# Patient Record
Sex: Male | Born: 1975 | Hispanic: No | Marital: Married | Smoking: Never smoker
Health system: Southern US, Community
[De-identification: ages and names within clinical notes are randomized; demographics above are authoritative.]

---

## 1998-03-10 ENCOUNTER — Emergency Department (HOSPITAL_COMMUNITY): Admission: EM | Admit: 1998-03-10 | Discharge: 1998-03-10 | Payer: Self-pay | Admitting: Endocrinology

## 1998-03-11 ENCOUNTER — Encounter: Admission: RE | Admit: 1998-03-11 | Discharge: 1998-06-09 | Payer: Self-pay | Admitting: Internal Medicine

## 1999-05-25 ENCOUNTER — Emergency Department (HOSPITAL_COMMUNITY): Admission: EM | Admit: 1999-05-25 | Discharge: 1999-05-25 | Payer: Self-pay | Admitting: Emergency Medicine

## 1999-05-25 ENCOUNTER — Encounter: Payer: Self-pay | Admitting: Emergency Medicine

## 2018-02-11 ENCOUNTER — Emergency Department (HOSPITAL_COMMUNITY): Payer: Self-pay

## 2018-02-11 ENCOUNTER — Emergency Department (HOSPITAL_COMMUNITY)
Admission: EM | Admit: 2018-02-11 | Discharge: 2018-02-11 | Disposition: A | Discharge status: Home / Self Care | Payer: Self-pay | Attending: Emergency Medicine | Admitting: Emergency Medicine

## 2018-02-11 ENCOUNTER — Encounter (HOSPITAL_COMMUNITY): Payer: Self-pay | Admitting: Emergency Medicine

## 2018-02-11 ENCOUNTER — Other Ambulatory Visit: Payer: Self-pay

## 2018-02-11 DIAGNOSIS — W19XXXA Unspecified fall, initial encounter: Secondary | ICD-10-CM

## 2018-02-11 DIAGNOSIS — Y990 Civilian activity done for income or pay: Secondary | ICD-10-CM | POA: Insufficient documentation

## 2018-02-11 DIAGNOSIS — T23002A Burn of unspecified degree of left hand, unspecified site, initial encounter: Secondary | ICD-10-CM

## 2018-02-11 DIAGNOSIS — W010XXA Fall on same level from slipping, tripping and stumbling without subsequent striking against object, initial encounter: Secondary | ICD-10-CM | POA: Insufficient documentation

## 2018-02-11 DIAGNOSIS — Y9389 Activity, other specified: Secondary | ICD-10-CM | POA: Insufficient documentation

## 2018-02-11 DIAGNOSIS — X088XXA Exposure to other specified smoke, fire and flames, initial encounter: Secondary | ICD-10-CM | POA: Insufficient documentation

## 2018-02-11 DIAGNOSIS — Y929 Unspecified place or not applicable: Secondary | ICD-10-CM | POA: Insufficient documentation

## 2018-02-11 DIAGNOSIS — Z23 Encounter for immunization: Secondary | ICD-10-CM | POA: Insufficient documentation

## 2018-02-11 DIAGNOSIS — T310 Burns involving less than 10% of body surface: Secondary | ICD-10-CM | POA: Insufficient documentation

## 2018-02-11 DIAGNOSIS — R112 Nausea with vomiting, unspecified: Secondary | ICD-10-CM

## 2018-02-11 DIAGNOSIS — T23262A Burn of second degree of back of left hand, initial encounter: Secondary | ICD-10-CM | POA: Insufficient documentation

## 2018-02-11 DIAGNOSIS — S0990XA Unspecified injury of head, initial encounter: Secondary | ICD-10-CM

## 2018-02-11 MED ORDER — ONDANSETRON HCL 4 MG PO TABS: 4.0000 mg | ORAL_TABLET | Freq: Three times a day (TID) | ORAL | 0 refills | Status: DC | PRN

## 2018-02-11 MED ORDER — SILVER SULFADIAZINE 1 % EX CREA
TOPICAL_CREAM | Freq: Once | CUTANEOUS | Status: AC
  Administered 2018-02-11: 11:00:00 via TOPICAL
  Filled 2018-02-11: qty 85

## 2018-02-11 MED ORDER — BACITRACIN ZINC 500 UNIT/GM EX OINT: TOPICAL_OINTMENT | Freq: Two times a day (BID) | CUTANEOUS | Status: DC

## 2018-02-11 MED ORDER — SILVER SULFADIAZINE 1 % EX CREA: 1.0000 "application " | TOPICAL_CREAM | Freq: Two times a day (BID) | CUTANEOUS | 0 refills | Status: DC

## 2018-02-11 MED ORDER — TETANUS-DIPHTH-ACELL PERTUSSIS 5-2.5-18.5 LF-MCG/0.5 IM SUSP
0.5000 mL | Freq: Once | INTRAMUSCULAR | Status: AC
  Administered 2018-02-11: 0.5 mL via INTRAMUSCULAR
  Filled 2018-02-11: qty 0.5

## 2018-02-11 NOTE — Discharge Instructions (Addendum)
You were given a prescription for a cream to put on your burn.  Please apply the cream twice daily until the burn has resolved.  Please keep the burn covered, clean and dry while you are at work.  Please monitor for signs of infection.  Please follow-up with your primary care doctor in 1 week for reevaluation.  Please return to the ER if you have any signs of infection such as fevers, pus drainage, warmth, swelling, redness or increased pain to the area. You may take zofran every 8 hours as needed for pain. If your symptoms do not resolve and you have persistent vomiting or abdominal pain you should return to the ER.

## 2018-02-11 NOTE — ED Triage Notes (Signed)
Patient to ED c/o burn to L hand 2 days ago while at work - reports he fell and his hand landed on one of the stove burners. Redness noted to hand, intact blister in between L index finger and middle finger. Saline soaked gauze applied in triage.

## 2018-02-11 NOTE — ED Provider Notes (Signed)
[facility] EMERGENCY DEPARTMENT Provider Note   CSN: [id number] Arrival date & time: 02/11/18  4098     History   Chief Complaint Chief Complaint  Patient presents with  . Hand Burn    HPI Seth Bailey is a 42 y.o. male.  HPI   Patient is a 42 year old male with no significant past history presents emergency department today to be evaluated for burn on his left hand which occurred 2 days ago.  Patient states that he was walking while holding some burners when he was at work, he tripped over a hose and fell onto his left wrist and then hit his head on the ground.  States he hit the back of his head where he did not pass out.  He has had a headache and several episodes of vomiting since this occurred.  Has had some cramping abdominal pain.  No diarrhea, constipation or urinary symptoms.  No fevers. Is complaining of pain to the left hand with the burn is located.  Is also complaining of pain to the left forearm. Denies any other injuries from the fall.  He denies any neck or back pain.  Blood thinners.  States that he has not had a tetanus shot greater than 10 years.  History reviewed. No pertinent past medical history.  There are no active problems to display for this patient.  History reviewed. No pertinent surgical history.    Home Medications    Prior to Admission medications   Medication Sig Start Date End Date Taking? Authorizing Provider  ondansetron (ZOFRAN) 4 MG tablet Take 1 tablet (4 mg total) by mouth every 8 (eight) hours as needed for nausea or vomiting. 02/11/18   Carlas Vandyne S, PA-C  silver sulfADIAZINE (SILVADENE) 1 % cream Apply 1 application topically 2 (two) times daily. Apply twice daily until burn is resolved 02/11/18   Robinn Overholt S, PA-C    Family History No family history on file.  Social History Social History   Tobacco Use  . Smoking status: Never Smoker  . Smokeless tobacco: Never Used  Substance Use Topics    . Alcohol use: Yes    Comment: occasionally  . Drug use: Never     Allergies   Patient has no known allergies.   Review of Systems Review of Systems  Constitutional: Negative for fever.  HENT: Negative for ear pain.   Eyes: Negative for pain and visual disturbance.  Respiratory: Negative for shortness of breath.   Cardiovascular: Negative for chest pain.  Gastrointestinal: Positive for nausea and vomiting. Negative for abdominal pain, blood in stool, constipation and diarrhea.  Genitourinary: Negative for dysuria and hematuria.  Musculoskeletal: Negative for back pain and neck pain.       Left forearm pain, left hand pain  Skin: Positive for wound.  Neurological: Positive for headaches.       Head trauma, no loc  All other systems reviewed and are negative.   Physical Exam Updated Vital Signs BP (!) 159/96 (BP Location: Right Arm)   Pulse 78   Temp 98.8 F (37.1 C) (Oral)   Resp 18   SpO2 93%   Physical Exam  Constitutional: He appears well-developed and well-nourished.  HENT:  Head: Normocephalic and atraumatic.  Mouth/Throat: Oropharynx is clear and moist.  No battle signs, no raccoons eyes, no rhinorrhea. No tenderness to palpation of the skull or face. No deformity or crepitus noted.  Eyes: Pupils are equal, round, and reactive to light. Conjunctivae and  EOM are normal.  Neck: Neck supple.  Cardiovascular: Normal rate, regular rhythm and normal heart sounds.  No murmur heard. Pulmonary/Chest: Effort normal and breath sounds normal. No respiratory distress.  Abdominal: Soft. Bowel sounds are normal. He exhibits no distension. There is no tenderness. There is no guarding.  Musculoskeletal: He exhibits no edema.  No TTP to the cervical, thoracic, or lumbar spine. No pain to the paraspinous muscles. TTP to the distal radius and ulna. No snuff box ttp, distal pulses and sensation intact. stength to BUE intact.   Neurological: He is alert.  Mental Status:  Alert,  thought content appropriate, able to give a coherent history. Speech fluent without evidence of aphasia. Able to follow 2 step commands without difficulty.  Cranial Nerves:  II:  pupils equal, round, reactive to light III,IV, VI: ptosis not present, extra-ocular motions intact bilaterally  V,VII: smile symmetric, facial light touch sensation equal VIII: hearing grossly normal to voice  X: uvula elevates symmetrically  XI: bilateral shoulder shrug symmetric and strong XII: midline tongue extension without fassiculations Motor:  Normal tone. 5/5 strength of BUE and BLE major muscle groups including strong and equal grip strength and dorsiflexion/plantar flexion Sensory: light touch normal in all extremities. CV: 2+ radial and DP/PT pulses  Skin: Skin is warm and dry. Capillary refill takes less than 2 seconds.  1-5cm superficial thickness burn to the lateral portion of the left second phalanx. 1cm superficial thickness burn with blister intact to medial aspect of left index finger. Area is TTP.  Psychiatric: He has a normal mood and affect.  Nursing note and vitals reviewed.   ED Treatments / Results  Labs (all labs ordered are listed, but only abnormal results are displayed) Labs Reviewed - No data to display  EKG None  Radiology Dg Forearm Left  Result Date: 02/11/2018 CLINICAL DATA:  Fall, pain. EXAM: LEFT FOREARM - 2 VIEW COMPARISON:  None. FINDINGS: There is no evidence of fracture or other focal bone lesions. Soft tissues are unremarkable. IMPRESSION: Negative. Electronically Signed   By: Bary RichardStan  Maynard M.D.   On: 02/11/2018 10:32   Dg Wrist Complete Left  Result Date: 02/11/2018 CLINICAL DATA:  Fall, pain. EXAM: LEFT WRIST - COMPLETE 3+ VIEW COMPARISON:  None. FINDINGS: There is no evidence of fracture or dislocation. There is no evidence of arthropathy or other focal bone abnormality. Soft tissues are unremarkable. IMPRESSION: Negative. Electronically Signed   By: Bary RichardStan  Maynard  M.D.   On: 02/11/2018 10:31   Ct Head Wo Contrast  Result Date: 02/11/2018 CLINICAL DATA:  Fall 2 days ago with headaches, initial encounter EXAM: CT HEAD WITHOUT CONTRAST TECHNIQUE: Contiguous axial images were obtained from the base of the skull through the vertex without intravenous contrast. COMPARISON:  None. FINDINGS: Brain: Mild atrophic changes are noted. No findings to suggest acute hemorrhage, acute infarction or space-occupying mass lesion are seen. Vascular: No hyperdense vessel or unexpected calcification. Skull: Normal. Negative for fracture or focal lesion. Sinuses/Orbits: No acute finding. Other: None. IMPRESSION: Mild atrophic changes without acute abnormality. Electronically Signed   By: Alcide CleverMark  Lukens M.D.   On: 02/11/2018 11:00    Procedures Procedures (including critical care time)  Medications Ordered in ED Medications  silver sulfADIAZINE (SILVADENE) 1 % cream ( Topical Given 02/11/18 1123)  Tdap (BOOSTRIX) injection 0.5 mL (0.5 mLs Intramuscular Given 02/11/18 1053)     Initial Impression / Assessment and Plan / ED Course  I have reviewed the triage vital signs and the nursing  notes.  Pertinent labs & imaging results that were available during my care of the patient were reviewed by me and considered in my medical decision making (see chart for details).    Discussed pt presentation and exam findings with Dr. Adriana Simas, who spoke with the patient and agrees with the plan for discharge. Advised to apply silvadene.   Final Clinical Impressions(s) / ED Diagnoses   Final diagnoses:  Burn of left hand, unspecified burn degree, unspecified site of hand, initial encounter  Fall, initial encounter  Injury of head, initial encounter  Non-intractable vomiting with nausea, unspecified vomiting type   Patient presenting after recent fall.  Has burn to the left hand, left forearm pain and head injury.  Episodes of vomiting since fall.  Vital signs stable.  Afebrile.  No midline  tenderness to spine.  No abdominal tenderness.  Neurologically patient is intact.  Burn to the hand is superficial.  Patient has pain to the area.  Does not appear infected.  Silvadene was applied and wound was dressed.  Patient was given information on wound care and was given prescription for Silvadene.  He was given information of follow-up with PCP about resolution of the burn.  His x-ray of his forearm and wrist were negative.  No snuffbox tenderness.  CT of the head was completed and was negative for any cranial abnormality.  Patient's Tdap was updated in the ED.  As above, patient given information for follow-up and instructed on strict return precautions and wound care.  He voiced understanding and agrees with plan.  All questions were answered.  ED Discharge Orders        Ordered    silver sulfADIAZINE (SILVADENE) 1 % cream  2 times daily     02/11/18 1107    ondansetron (ZOFRAN) 4 MG tablet  Every 8 hours PRN     02/11/18 1118       Soua Lenk S, PA-C 02/11/18 1525    Donnetta Hutching, MD 02/14/18 2051

## 2020-01-23 ENCOUNTER — Encounter (HOSPITAL_COMMUNITY): Payer: Self-pay | Admitting: Emergency Medicine

## 2020-01-23 ENCOUNTER — Emergency Department (HOSPITAL_COMMUNITY): Payer: Self-pay

## 2020-01-23 ENCOUNTER — Other Ambulatory Visit: Payer: Self-pay

## 2020-01-23 ENCOUNTER — Emergency Department (HOSPITAL_COMMUNITY)
Admission: EM | Admit: 2020-01-23 | Discharge: 2020-01-24 | Discharge status: Against Medical Advice | Payer: Self-pay | Attending: Emergency Medicine | Admitting: Emergency Medicine

## 2020-01-23 DIAGNOSIS — R110 Nausea: Secondary | ICD-10-CM | POA: Insufficient documentation

## 2020-01-23 DIAGNOSIS — R519 Headache, unspecified: Secondary | ICD-10-CM | POA: Insufficient documentation

## 2020-01-23 DIAGNOSIS — Y939 Activity, unspecified: Secondary | ICD-10-CM | POA: Insufficient documentation

## 2020-01-23 DIAGNOSIS — Y929 Unspecified place or not applicable: Secondary | ICD-10-CM | POA: Insufficient documentation

## 2020-01-23 DIAGNOSIS — Y999 Unspecified external cause status: Secondary | ICD-10-CM | POA: Insufficient documentation

## 2020-01-23 DIAGNOSIS — R0602 Shortness of breath: Secondary | ICD-10-CM | POA: Insufficient documentation

## 2020-01-23 DIAGNOSIS — W01198A Fall on same level from slipping, tripping and stumbling with subsequent striking against other object, initial encounter: Secondary | ICD-10-CM | POA: Insufficient documentation

## 2020-01-23 DIAGNOSIS — S298XXA Other specified injuries of thorax, initial encounter: Secondary | ICD-10-CM | POA: Insufficient documentation

## 2020-01-23 DIAGNOSIS — S161XXA Strain of muscle, fascia and tendon at neck level, initial encounter: Secondary | ICD-10-CM | POA: Insufficient documentation

## 2020-01-23 LAB — BASIC METABOLIC PANEL
Anion gap: 14 (ref 5–15)
BUN: 5 mg/dL — ABNORMAL LOW (ref 6–20)
CO2: 27 mmol/L (ref 22–32)
Calcium: 9.1 mg/dL (ref 8.9–10.3)
Chloride: 98 mmol/L (ref 98–111)
Creatinine, Ser: 0.57 mg/dL — ABNORMAL LOW (ref 0.61–1.24)
GFR calc Af Amer: >60 mL/min (ref >60)
GFR calc non Af Amer: >60 mL/min (ref >60)
Glucose, Bld: 100 mg/dL — ABNORMAL HIGH (ref 70–99)
Potassium: 3.8 mmol/L (ref 3.5–5.1)
Sodium: 139 mmol/L (ref 135–145)

## 2020-01-23 LAB — TROPONIN I (HIGH SENSITIVITY): Troponin I (High Sensitivity): 5 ng/L (ref <18)

## 2020-01-23 LAB — CBC
HCT: 40.4 % (ref 39.0–52.0)
Hemoglobin: 13.7 g/dL (ref 13.0–17.0)
MCH: 29.4 pg (ref 26.0–34.0)
MCHC: 33.9 g/dL (ref 30.0–36.0)
MCV: 86.7 fL (ref 80.0–100.0)
Platelets: 149 10*3/uL — ABNORMAL LOW (ref 150–400)
RBC: 4.66 MIL/uL (ref 4.22–5.81)
RDW: 12.9 % (ref 11.5–15.5)
WBC: 6 10*3/uL (ref 4.0–10.5)
nRBC: 0 % (ref 0.0–0.2)

## 2020-01-23 MED ORDER — SODIUM CHLORIDE 0.9% FLUSH: 3.0000 mL | Freq: Once | INTRAVENOUS | Status: DC

## 2020-01-23 MED ORDER — IBUPROFEN 400 MG PO TABS
400.0000 mg | ORAL_TABLET | Freq: Once | ORAL | Status: AC
Start: 2020-01-24 — End: 2020-01-23
  Administered 2020-01-23: 400 mg via ORAL
  Filled 2020-01-23: qty 1

## 2020-01-23 MED ORDER — HYDROCODONE-ACETAMINOPHEN 5-325 MG PO TABS
1.0000 | ORAL_TABLET | Freq: Once | ORAL | Status: AC
  Administered 2020-01-23: 1 via ORAL
  Filled 2020-01-23: qty 1

## 2020-01-23 NOTE — ED Provider Notes (Signed)
[facility] EMERGENCY DEPARTMENT Provider Note   CSN: [id number] Arrival date & time: 01/23/20  1659     History Chief Complaint  Patient presents with  . Chest Pain    Seth Bailey is a 44 y.o. male.  The history is provided by the patient. A language interpreter was used Darien Ramus - [place] [phone]).  Chest Pain Pain location:  Substernal area Pain radiates to:  Does not radiate Onset quality:  Sudden Duration:  2 days Timing:  Constant Progression:  Worsening Chronicity:  New Context: trauma   Ineffective treatments:  Certain positions (breathing) Associated symptoms: headache, nausea and shortness of breath   Associated symptoms: no fever and no vomiting   Associated symptoms comment:  Denies hemoptysis Risk factors: no smoking   patient was doing cartwheels and fell and something "Cracked" He reports he landed on his head then heard his chest crack No LOC Now reports neck pain       PMH- none Social History   Tobacco Use  . Smoking status: Never Smoker  . Smokeless tobacco: Never Used  Substance Use Topics  . Alcohol use: Yes    Comment: occasionally  . Drug use: Never    Home Medications Prior to Admission medications   Medication Sig Start Date End Date Taking? Authorizing Provider  ondansetron (ZOFRAN) 4 MG tablet Take 1 tablet (4 mg total) by mouth every 8 (eight) hours as needed for nausea or vomiting. 02/11/18   Couture, Cortni S, PA-C  silver sulfADIAZINE (SILVADENE) 1 % cream Apply 1 application topically 2 (two) times daily. Apply twice daily until burn is resolved 02/11/18   Couture, Cortni S, PA-C    Allergies    Patient has no known allergies.  Review of Systems   Review of Systems  Constitutional: Negative for fever.  Respiratory: Positive for shortness of breath.   Cardiovascular: Positive for chest pain.  Gastrointestinal: Positive for nausea. Negative for vomiting.  Musculoskeletal: Positive for neck pain.    Neurological: Positive for headaches. Negative for syncope.       Mild headache  All other systems reviewed and are negative.   Physical Exam Updated Vital Signs BP 138/88 (BP Location: Left Arm)   Pulse 69   Temp 98.1 F (36.7 C) (Oral)   Resp 16   Ht 1.651 m (5\' 5" )   Wt 77.1 kg   SpO2 97%   BMI 28.29 kg/m   Physical Exam CONSTITUTIONAL: Well developed/well nourished HEAD: Normocephalic/atraumatic EYES: EOMI/PERRL ENMT: Mucous membranes moist SPINE/BACK:cervical spine tenderness noted, No bruising/crepitance/stepoffs noted to spine No T/L tenderness CV: S1/S2 noted, no murmurs/rubs/gallops noted LUNGS: Lungs are clear to auscultation bilaterally, no apparent distress Chest - tenderness to sternum.  No crepitus.  Mild bruising.  No flail chest.  No deformities ABDOMEN: soft, nontender, no rebound or guarding, bowel sounds noted throughout abdomen GU:no cva tenderness NEURO: Pt is awake/alert/appropriate, moves all extremitiesx4.  No facial droop. Pt is ambulatory.  No obvious weakness in his upper extremities EXTREMITIES: pulses normal/equal, full ROM.  No deformities SKIN: warm, color normal PSYCH: no abnormalities of mood noted, alert and oriented to situation  ED Results / Procedures / Treatments   Labs (all labs ordered are listed, but only abnormal results are displayed) Labs Reviewed  BASIC METABOLIC PANEL - Abnormal; Notable for the following components:      Result Value   Glucose, Bld 100 (*)    BUN 5 (*)    Creatinine, Ser 0.57 (*)  All other components within normal limits  CBC - Abnormal; Notable for the following components:   Platelets 149 (*)    All other components within normal limits  TROPONIN I (HIGH SENSITIVITY)  TROPONIN I (HIGH SENSITIVITY)    EKG EKG Interpretation  Date/Time:  Tuesday January 23 2020 17:04:41 EDT Ventricular Rate:  75 PR Interval:  152 QRS Duration: 102 QT Interval:  376 QTC Calculation: 419 R Axis:   -10 Text  Interpretation: Normal sinus rhythm Incomplete right bundle branch block Moderate voltage criteria for LVH, may be normal variant ( R in aVL , Cornell product ) Borderline ECG No previous ECGs available Interpretation limited secondary to artifact Confirmed by Ripley Fraise [phone]) on 01/23/2020 11:32:08 PM   Radiology DG Chest 2 View  Result Date: 01/23/2020 CLINICAL DATA:  Chest pain EXAM: CHEST - 2 VIEW COMPARISON:  None. FINDINGS: The heart size and mediastinal contours are within normal limits. Both lungs are clear. The visualized skeletal structures are unremarkable. IMPRESSION: No active cardiopulmonary disease. Electronically Signed   By: Rolm Baptise M.D.   On: 01/23/2020 18:35    Procedures Procedures    Medications Ordered in ED Medications  sodium chloride flush (NS) 0.9 % injection 3 mL (has no administration in time range)  ibuprofen (ADVIL) tablet 400 mg (400 mg Oral Given 01/23/20 2359)  HYDROcodone-acetaminophen (NORCO/VICODIN) 5-325 MG per tablet 1 tablet (1 tablet Oral Given 01/23/20 2359)    ED Course  I have reviewed the triage vital signs and the nursing notes.  Pertinent labs & imaging results that were available during my care of the patient were reviewed by me and considered in my medical decision making (see chart for details).    MDM Rules/Calculators/A&P                      Patient presents after he fell on his head and his neck and chest over 2 days ago while playing with his daughters.  Most of his pain is in his sternum.  No crepitus.  No fractures on x-ray.  I personally reviewed x-rays. He also reported neck pain, and I advised cervical spine x-ray.  He has no focal neuro deficits in his extremities.  He refuses neck xray and requested discharge home.  Advised he can return at anytime.   Final Clinical Impression(s) / ED Diagnoses Final diagnoses:  Blunt trauma to chest, initial encounter  Strain of neck muscle, initial encounter    Rx / DC Orders ED  Discharge Orders    None       Ripley Fraise, MD 01/24/20 [phone]

## 2020-01-23 NOTE — ED Notes (Signed)
ED Provider at bedside. 

## 2020-01-23 NOTE — ED Triage Notes (Signed)
Patient arrives to ED with complaints of centralized chest pain and shortness of breath starting two days ago. Patient states he fell and hit his chest and thinks something popped. Patient states after the fall he threw up blood.

## 2020-01-24 NOTE — ED Notes (Signed)
Pt choosing to leave AMA; MD Wickline at bedside to discuss risks, this RN confirmed risks; pt aware & still to leave AMA. Pt currently calling ride.

## 2020-03-21 ENCOUNTER — Emergency Department (HOSPITAL_COMMUNITY): Payer: Self-pay

## 2020-03-21 ENCOUNTER — Other Ambulatory Visit: Payer: Self-pay

## 2020-03-21 ENCOUNTER — Emergency Department (HOSPITAL_COMMUNITY)
Admission: EM | Admit: 2020-03-21 | Discharge: 2020-03-21 | Disposition: A | Discharge status: Home / Self Care | Payer: Self-pay | Attending: Emergency Medicine | Admitting: Emergency Medicine

## 2020-03-21 ENCOUNTER — Encounter (HOSPITAL_COMMUNITY): Payer: Self-pay

## 2020-03-21 DIAGNOSIS — S0990XA Unspecified injury of head, initial encounter: Secondary | ICD-10-CM

## 2020-03-21 DIAGNOSIS — Y9289 Other specified places as the place of occurrence of the external cause: Secondary | ICD-10-CM | POA: Insufficient documentation

## 2020-03-21 DIAGNOSIS — S0101XA Laceration without foreign body of scalp, initial encounter: Secondary | ICD-10-CM | POA: Insufficient documentation

## 2020-03-21 DIAGNOSIS — W1809XA Striking against other object with subsequent fall, initial encounter: Secondary | ICD-10-CM | POA: Insufficient documentation

## 2020-03-21 DIAGNOSIS — Y999 Unspecified external cause status: Secondary | ICD-10-CM | POA: Insufficient documentation

## 2020-03-21 DIAGNOSIS — Y9389 Activity, other specified: Secondary | ICD-10-CM | POA: Insufficient documentation

## 2020-03-21 NOTE — ED Triage Notes (Signed)
Pt working on tearing a house down and a piece of wood struck pt. Denies LOC. Covered with home cleaner unable to visualize if laceration present.

## 2020-03-21 NOTE — ED Provider Notes (Signed)
[facility]-EMERGENCY DEPT Provider Note   CSN: [id number] Arrival date & time: 03/21/20  1903     History Chief Complaint  Patient presents with  . Head Injury    Seth Bailey is a 44 y.o. male.  Patient is a 44 year old male with no significant past medical history.  He presents for evaluation of a head injury.  Patient was helping his friend renovating a house 4 days ago when a board fell over and struck the top of his head.  This caused a laceration to the frontal region.  Patient applied over-the-counter antiseptic.  He presents today with ongoing headaches and nausea.  He denies any blurry vision.  He denies any numbness or tingling.  The history is provided by the patient.       History reviewed. No pertinent past medical history.  There are no problems to display for this patient.   History reviewed. No pertinent surgical history.     No family history on file.  Social History   Tobacco Use  . Smoking status: Never Smoker  . Smokeless tobacco: Never Used  Substance Use Topics  . Alcohol use: Yes    Comment: occasionally  . Drug use: Never    Home Medications Prior to Admission medications   Not on File    Allergies    Patient has no known allergies.  Review of Systems   Review of Systems  All other systems reviewed and are negative.   Physical Exam Updated Vital Signs BP (!) 141/83 (BP Location: Left Arm)   Pulse 92   Temp 98 F (36.7 C) (Oral)   Resp 17   Ht 5\' 5"  (1.651 m)   Wt 77 kg   SpO2 95%   BMI 28.25 kg/m   Physical Exam Vitals and nursing note reviewed.  Constitutional:      General: He is not in acute distress.    Appearance: Normal appearance. He is not ill-appearing.  HENT:     Head: Normocephalic.     Comments: There is a healing laceration noted to the left frontal scalp.  There is a pink antiseptic solution in place.  This was cleaned with normal saline. Eyes:     Extraocular  Movements: Extraocular movements intact.     Pupils: Pupils are equal, round, and reactive to light.  Pulmonary:     Effort: Pulmonary effort is normal.  Skin:    General: Skin is warm and dry.  Neurological:     General: No focal deficit present.     Mental Status: He is alert and oriented to person, place, and time.     Cranial Nerves: No cranial nerve deficit.     Sensory: No sensory deficit.     Motor: No weakness.     Coordination: Coordination normal.     ED Results / Procedures / Treatments   Labs (all labs ordered are listed, but only abnormal results are displayed) Labs Reviewed - No data to display  EKG None  Radiology No results found.  Procedures Procedures (including critical care time)  Medications Ordered in ED Medications - No data to display  ED Course  I have reviewed the triage vital signs and the nursing notes.  Pertinent labs & imaging results that were available during my care of the patient were reviewed by me and considered in my medical decision making (see chart for details).    MDM Rules/Calculators/A&P  CT negative, patient neurologically intact.  Wound inspected and  cleaned and appears to be healing well.    To be discharged, return prn.  Final Clinical Impression(s) / ED Diagnoses Final diagnoses:  None    Rx / DC Orders ED Discharge Orders    None       Geoffery Lyons, MD 03/21/20 2052

## 2020-03-21 NOTE — Discharge Instructions (Signed)
Take tylenol 1000 mg every six hours as needed for pain.  Local wound care with bacitracin applications twice daily.

## 2020-09-18 ENCOUNTER — Ambulatory Visit (HOSPITAL_COMMUNITY): Payer: Self-pay

## 2020-09-18 ENCOUNTER — Ambulatory Visit (INDEPENDENT_AMBULATORY_CARE_PROVIDER_SITE_OTHER): Payer: Self-pay

## 2020-09-18 ENCOUNTER — Ambulatory Visit (HOSPITAL_COMMUNITY)
Admission: EM | Admit: 2020-09-18 | Discharge: 2020-09-18 | Disposition: A | Discharge status: Home / Self Care | Payer: Self-pay | Attending: Internal Medicine | Admitting: Internal Medicine

## 2020-09-18 ENCOUNTER — Encounter (HOSPITAL_COMMUNITY): Payer: Self-pay | Admitting: Emergency Medicine

## 2020-09-18 ENCOUNTER — Other Ambulatory Visit: Payer: Self-pay

## 2020-09-18 DIAGNOSIS — S93402A Sprain of unspecified ligament of left ankle, initial encounter: Secondary | ICD-10-CM

## 2020-09-18 DIAGNOSIS — S99912A Unspecified injury of left ankle, initial encounter: Secondary | ICD-10-CM

## 2020-09-18 MED ORDER — IBUPROFEN 600 MG PO TABS: 600.0000 mg | ORAL_TABLET | Freq: Four times a day (QID) | ORAL | 0 refills | Status: AC | PRN

## 2020-09-18 NOTE — Discharge Instructions (Signed)
Gentle range of motion exercises Icing of the left ankle Take medications as directed If symptoms worsen please return to urgent care to be reevaluated.

## 2020-09-18 NOTE — ED Triage Notes (Signed)
Pt presents with left foot and ankle pain after board/ wood fell on ankle at job this morning.

## 2020-09-19 NOTE — ED Provider Notes (Signed)
[facility]    CSN: [id number] Arrival date & time: 09/18/20  1424      History   Chief Complaint Chief Complaint  Patient presents with  . Foot Pain  . Ankle Pain    HPI Seth Bailey is a 45 y.o. male comes to the urgent care with complaints of left foot and ankle pain after a piece of plywood fell on his foot.  Patient describes the pain as severe, constant and throbbing.  Aggravated by movement and palpation.  Patient is able to bear weight on the foot.  No numbness or tingling.   He has not tried any over-the-counter medications. HPI  History reviewed. No pertinent past medical history.  There are no problems to display for this patient.   History reviewed. No pertinent surgical history.     Home Medications    Prior to Admission medications   Medication Sig Start Date End Date Taking? Authorizing Provider  ibuprofen (ADVIL) 600 MG tablet Take 1 tablet (600 mg total) by mouth every 6 (six) hours as needed. 09/18/20  Yes Lilyonna Steidle, Britta Mccreedy, MD    Family History History reviewed. No pertinent family history.  Social History Social History   Tobacco Use  . Smoking status: Never Smoker  . Smokeless tobacco: Never Used  Substance Use Topics  . Alcohol use: Yes    Comment: occasionally  . Drug use: Never     Allergies   Patient has no known allergies.   Review of Systems Review of Systems  Constitutional: Negative.   Gastrointestinal: Negative.   Musculoskeletal: Positive for arthralgias. Negative for joint swelling and myalgias.  Neurological: Negative.      Physical Exam Triage Vital Signs ED Triage Vitals  Enc Vitals Group     BP 09/18/20 1535 115/77     Pulse Rate 09/18/20 1535 85     Resp 09/18/20 1535 16     Temp 09/18/20 1535 97.8 F (36.6 C)     Temp Source 09/18/20 1535 Oral     SpO2 09/18/20 1535 98 %     Weight --      Height --      Head Circumference --      Peak Flow --      Pain Score 09/18/20 1532 5      Pain Loc --      Pain Edu? --      Excl. in GC? --    No data found.  Updated Vital Signs BP 115/77 (BP Location: Right Arm)   Pulse 85   Temp 97.8 F (36.6 C) (Oral)   Resp 16   SpO2 98%   Visual Acuity Right Eye Distance:   Left Eye Distance:   Bilateral Distance:    Right Eye Near:   Left Eye Near:    Bilateral Near:     Physical Exam Vitals and nursing note reviewed.  Constitutional:      General: He is in acute distress.     Appearance: He is not ill-appearing.  Musculoskeletal:        General: Tenderness and signs of injury present. No swelling or deformity. Normal range of motion.     Comments: Full range of motion of the left ankle with some pain.  Skin:    Findings: Bruising present.  Neurological:     Mental Status: He is alert.      UC Treatments / Results  Labs (all labs ordered are listed, but only abnormal results are  displayed) Labs Reviewed - No data to display  EKG   Radiology DG Ankle Complete Left  Result Date: 09/18/2020 CLINICAL DATA:  Left ankle trauma EXAM: LEFT ANKLE COMPLETE - 3+ VIEW COMPARISON:  None. FINDINGS: Frontal, oblique, lateral views of the left ankle are obtained. No fracture, subluxation, or dislocation. Mild anterior and lateral soft tissue swelling. Minimal atherosclerosis. IMPRESSION: 1. Anterolateral soft tissue swelling.  No acute fracture. Electronically Signed   By: Sharlet Salina M.D.   On: 09/18/2020 16:38    Procedures Procedures (including critical care time)  Medications Ordered in UC Medications - No data to display  Initial Impression / Assessment and Plan / UC Course  I have reviewed the triage vital signs and the nursing notes.  Pertinent labs & imaging results that were available during my care of the patient were reviewed by me and considered in my medical decision making (see chart for details).     1.  Left ankle sprain: X-ray of the left ankle is negative for acute fracture. Ace wrap of  the left ankle Ibuprofen every 6 hours as needed for pain Gentle range of motion exercises Icing of the left ankle Return precautions given. Final Clinical Impressions(s) / UC Diagnoses   Final diagnoses:  Sprain of left ankle, unspecified ligament, initial encounter     Discharge Instructions     Gentle range of motion exercises Icing of the left ankle Take medications as directed If symptoms worsen please return to urgent care to be reevaluated.   ED Prescriptions    Medication Sig Dispense Auth. Provider   ibuprofen (ADVIL) 600 MG tablet Take 1 tablet (600 mg total) by mouth every 6 (six) hours as needed. 30 tablet Mikaya Bunner, Britta Mccreedy, MD     PDMP not reviewed this encounter.   Merrilee Jansky, MD 09/19/20 1501

## 2021-05-28 IMAGING — CT CT HEAD W/O CM
3 series · 15 of 47 positions shown, 18 images · non-contrast
Comparison: Head CT 02/11/2018

CLINICAL DATA: Struck by piece of [employer] while tearing down house.

Head trauma, minor, normal mental status (Age 19-64y)
EXAM:
CT HEAD WITHOUT CONTRAST
TECHNIQUE: Contiguous axial images were obtained from the base of the skull
through the vertex without intravenous contrast.

[Series 2: head wo · axial · 0.43mm/px · z∈[-99,+36]mm · 9 of 33 slices shown, 12 images]
[im 3/33  brain]
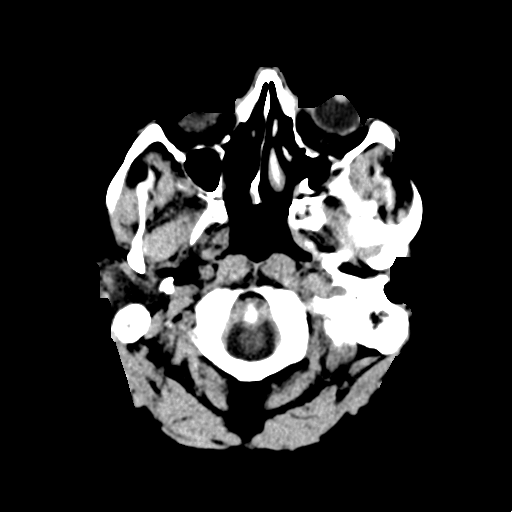
[im 3/33  bone]
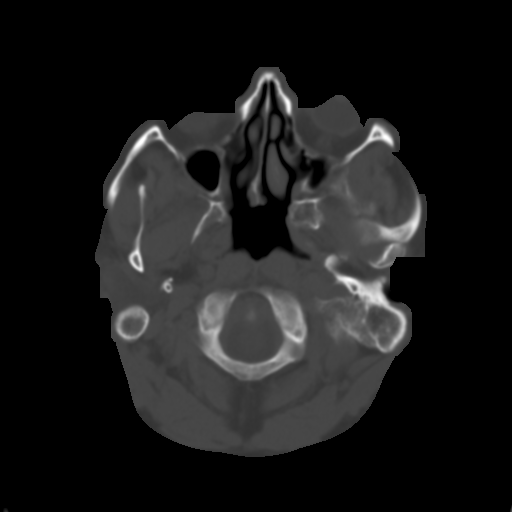
[im 6/33  brain]
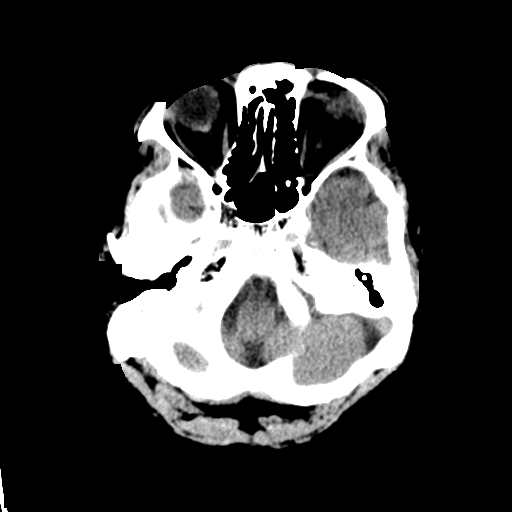
[im 9/33  brain]
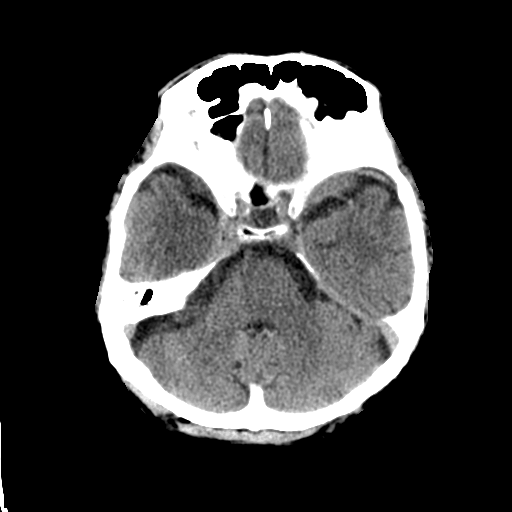
[im 13/33  brain]
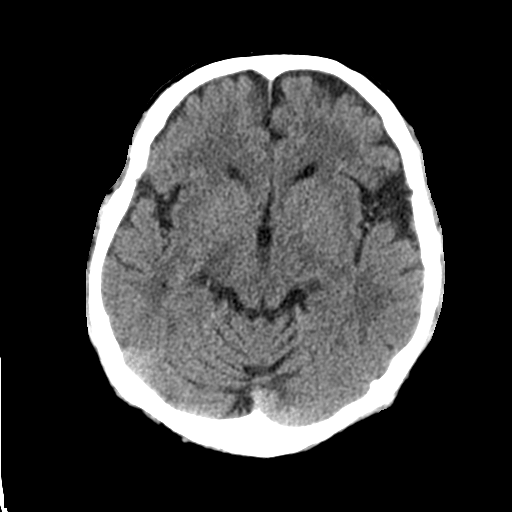
[im 17/33  brain]
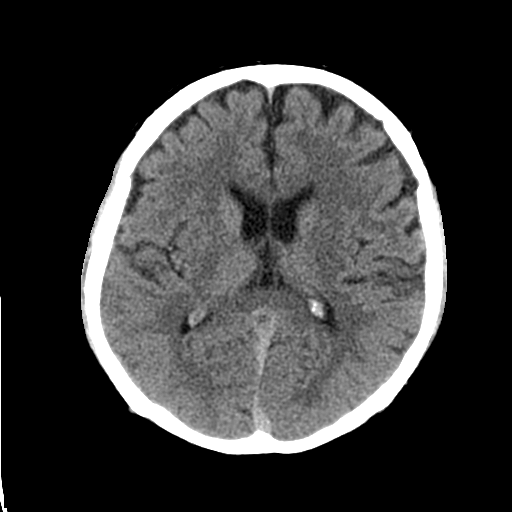
[im 17/33  bone]
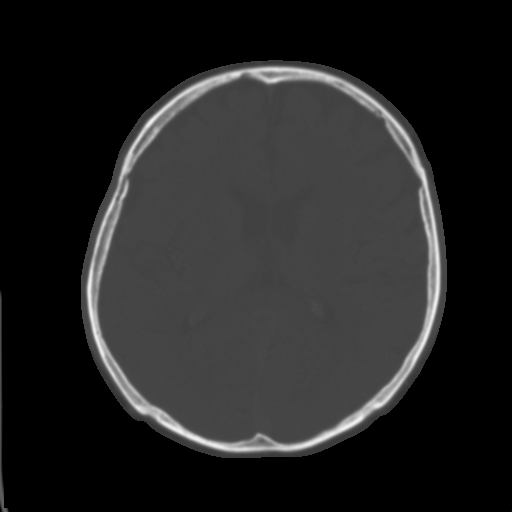
[im 20/33  brain]
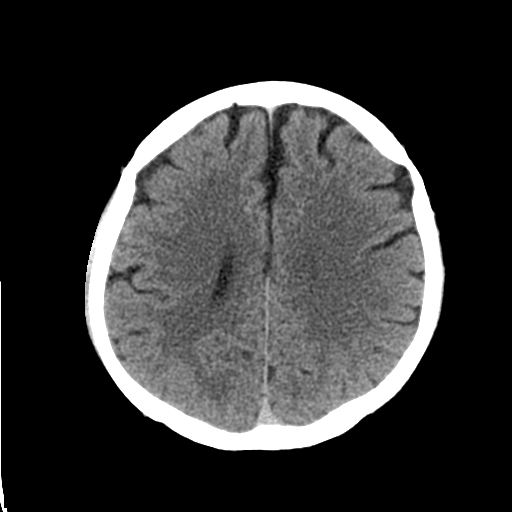
[im 24/33  brain]
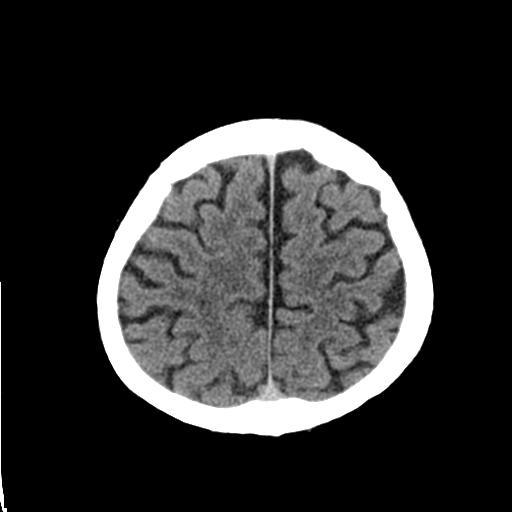
[im 27/33  brain]
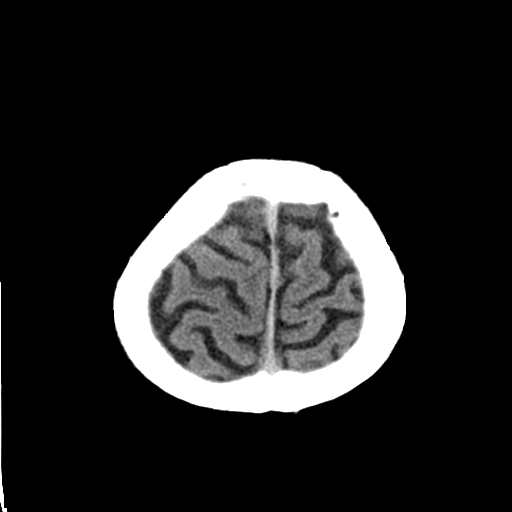
[im 30/33  brain]
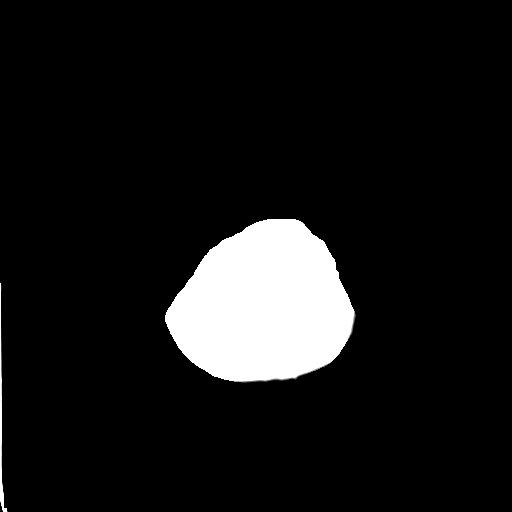
[im 30/33  bone]
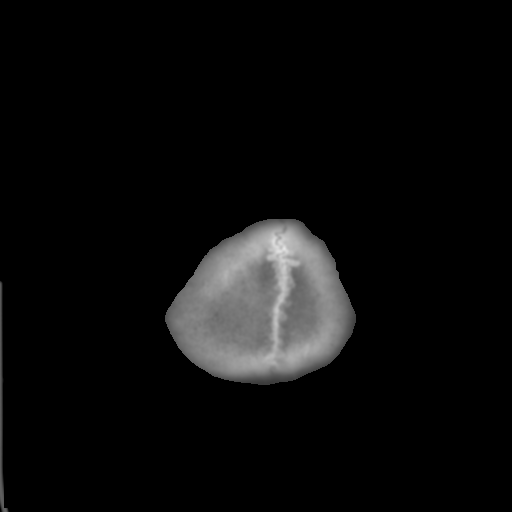

[Series 4: coronal soft tissue · coronal · 0.30mm/px · 3 of 62 slices shown]
[im 21/62  brain]
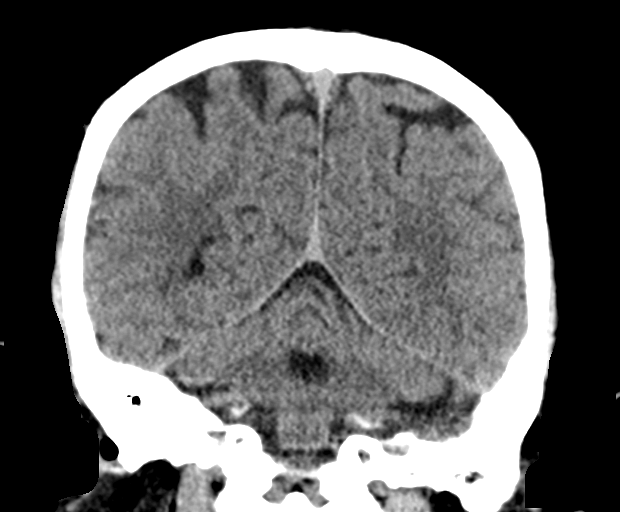
[im 28/62  brain]
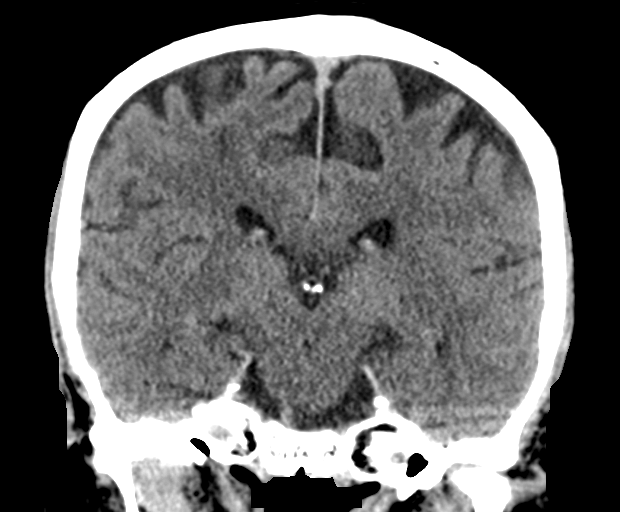
[im 34/62  brain]
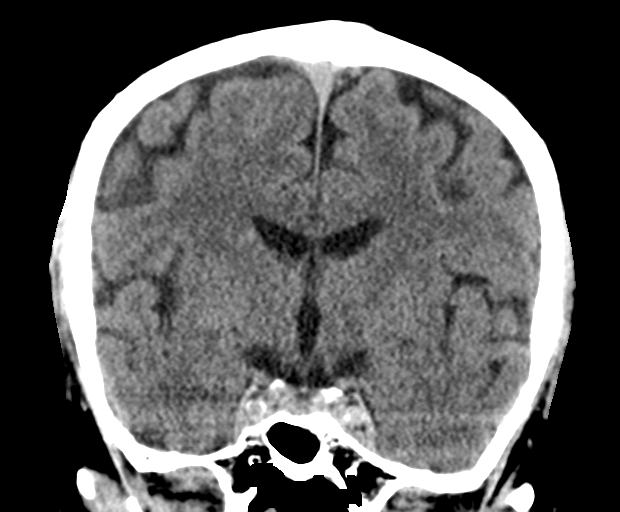

[Series 5: sagittal soft tissue · sagittal · 0.31mm/px · 3 of 62 slices shown]
[im 21/62  brain]
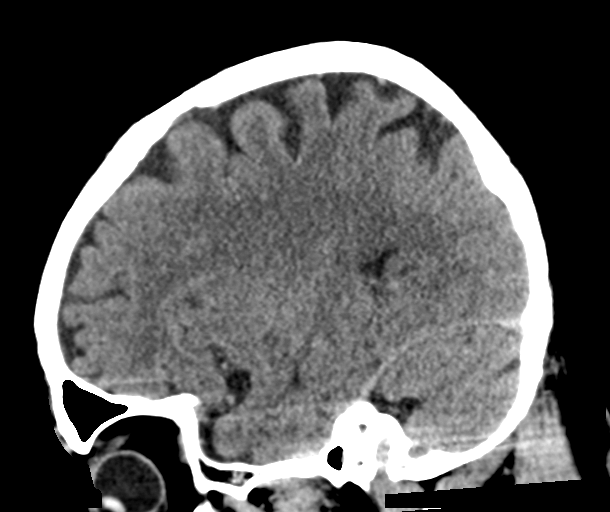
[im 31/62  brain]
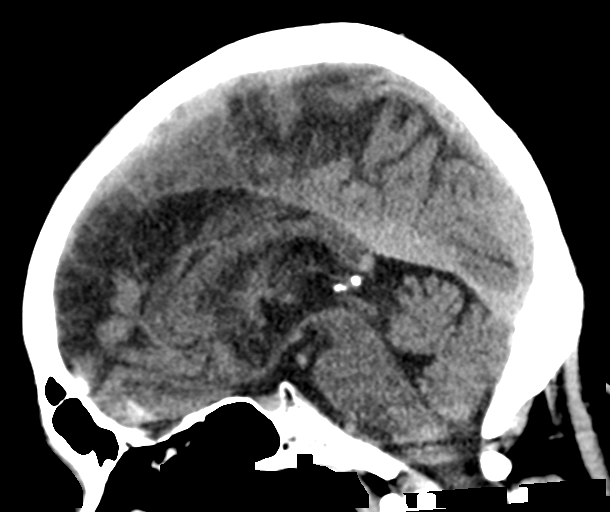
[im 41/62  brain]
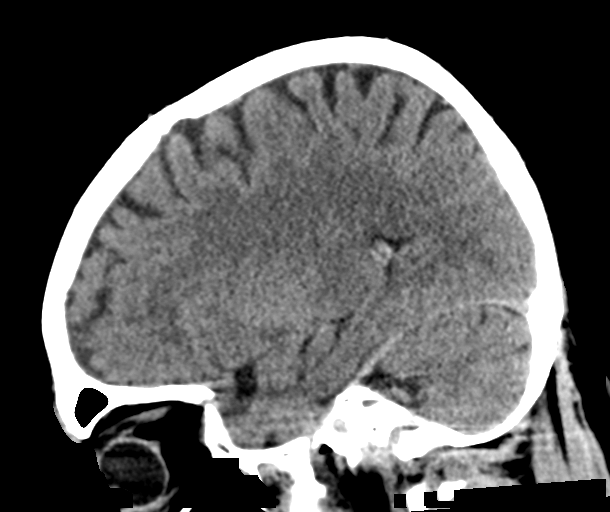

[15 of 47 positions shown; findings below may reference images not displayed]

FINDINGS: Brain: No intracranial hemorrhage, mass effect, or midline shift. No
hydrocephalus. The basilar cisterns are patent. No evidence of
territorial infarct or acute ischemia. No extra-axial or
intracranial fluid collection.

Vascular: No hyperdense vessel or unexpected calcification.

Skull: Normal. Negative for fracture or focal lesion.

Sinuses/Orbits: Paranasal sinuses and mastoid air cells are clear.
The visualized orbits are unremarkable.

Other: Small left frontal scalp hematoma.
IMPRESSION: Small left frontal scalp hematoma. No acute intracranial
abnormality. No skull fracture.

## 2021-08-14 ENCOUNTER — Emergency Department (HOSPITAL_COMMUNITY)
Admission: EM | Admit: 2021-08-14 | Discharge: 2021-08-14 | Disposition: A | Discharge status: Home / Self Care | Payer: Self-pay | Attending: Emergency Medicine | Admitting: Emergency Medicine

## 2021-08-14 ENCOUNTER — Other Ambulatory Visit: Payer: Self-pay

## 2021-08-14 ENCOUNTER — Encounter (HOSPITAL_COMMUNITY): Payer: Self-pay | Admitting: Emergency Medicine

## 2021-08-14 DIAGNOSIS — R509 Fever, unspecified: Secondary | ICD-10-CM | POA: Insufficient documentation

## 2021-08-14 DIAGNOSIS — Z20822 Contact with and (suspected) exposure to covid-19: Secondary | ICD-10-CM | POA: Insufficient documentation

## 2021-08-14 DIAGNOSIS — J029 Acute pharyngitis, unspecified: Secondary | ICD-10-CM | POA: Insufficient documentation

## 2021-08-14 DIAGNOSIS — K529 Noninfective gastroenteritis and colitis, unspecified: Secondary | ICD-10-CM

## 2021-08-14 DIAGNOSIS — R519 Headache, unspecified: Secondary | ICD-10-CM | POA: Insufficient documentation

## 2021-08-14 DIAGNOSIS — R1084 Generalized abdominal pain: Secondary | ICD-10-CM | POA: Insufficient documentation

## 2021-08-14 DIAGNOSIS — R112 Nausea with vomiting, unspecified: Secondary | ICD-10-CM | POA: Insufficient documentation

## 2021-08-14 DIAGNOSIS — R0981 Nasal congestion: Secondary | ICD-10-CM | POA: Insufficient documentation

## 2021-08-14 DIAGNOSIS — J3489 Other specified disorders of nose and nasal sinuses: Secondary | ICD-10-CM | POA: Insufficient documentation

## 2021-08-14 LAB — URINALYSIS, ROUTINE W REFLEX MICROSCOPIC
Bacteria, UA: NONE SEEN
Bilirubin Urine: NEGATIVE
Glucose, UA: NEGATIVE mg/dL
Hgb urine dipstick: NEGATIVE
Ketones, ur: NEGATIVE mg/dL
Leukocytes,Ua: NEGATIVE
Nitrite: NEGATIVE
Protein, ur: 100 mg/dL — AB
Specific Gravity, Urine: 1.01 (ref 1.005–1.030)
pH: 8 (ref 5.0–8.0)

## 2021-08-14 LAB — CBC WITH DIFFERENTIAL/PLATELET
Abs Immature Granulocytes: 0.02 10*3/uL (ref 0.00–0.07)
Basophils Absolute: 0.1 10*3/uL (ref 0.0–0.1)
Basophils Relative: 1 %
Eosinophils Absolute: 0 10*3/uL (ref 0.0–0.5)
Eosinophils Relative: 0 %
HCT: 46.7 % (ref 39.0–52.0)
Hemoglobin: 15.7 g/dL (ref 13.0–17.0)
Immature Granulocytes: 0 %
Lymphocytes Relative: 23 %
Lymphs Abs: 1.3 10*3/uL (ref 0.7–4.0)
MCH: 30.2 pg (ref 26.0–34.0)
MCHC: 33.6 g/dL (ref 30.0–36.0)
MCV: 89.8 fL (ref 80.0–100.0)
Monocytes Absolute: 0.5 10*3/uL (ref 0.1–1.0)
Monocytes Relative: 9 %
Neutro Abs: 3.8 10*3/uL (ref 1.7–7.7)
Neutrophils Relative %: 67 %
Platelets: 143 10*3/uL — ABNORMAL LOW (ref 150–400)
RBC: 5.2 MIL/uL (ref 4.22–5.81)
RDW: 12.5 % (ref 11.5–15.5)
WBC: 5.6 10*3/uL (ref 4.0–10.5)
nRBC: 0 % (ref 0.0–0.2)

## 2021-08-14 LAB — RESP PANEL BY RT-PCR (FLU A&B, COVID) ARPGX2
Influenza A by PCR: NEGATIVE
Influenza B by PCR: NEGATIVE
SARS Coronavirus 2 by RT PCR: NEGATIVE

## 2021-08-14 LAB — LIPASE, BLOOD: Lipase: 46 U/L (ref 11–51)

## 2021-08-14 LAB — COMPREHENSIVE METABOLIC PANEL
ALT: 21 U/L (ref 0–44)
AST: 65 U/L — ABNORMAL HIGH (ref 15–41)
Albumin: 4.2 g/dL (ref 3.5–5.0)
Alkaline Phosphatase: 148 U/L — ABNORMAL HIGH (ref 38–126)
Anion gap: 7 (ref 5–15)
BUN: 6 mg/dL (ref 6–20)
CO2: 27 mmol/L (ref 22–32)
Calcium: 9 mg/dL (ref 8.9–10.3)
Chloride: 99 mmol/L (ref 98–111)
Creatinine, Ser: 0.6 mg/dL — ABNORMAL LOW (ref 0.61–1.24)
GFR, Estimated: >60 mL/min (ref >60)
Glucose, Bld: 105 mg/dL — ABNORMAL HIGH (ref 70–99)
Potassium: 3.4 mmol/L — ABNORMAL LOW (ref 3.5–5.1)
Sodium: 133 mmol/L — ABNORMAL LOW (ref 135–145)
Total Bilirubin: 2.5 mg/dL — ABNORMAL HIGH (ref 0.3–1.2)
Total Protein: 9.5 g/dL — ABNORMAL HIGH (ref 6.5–8.1)

## 2021-08-14 LAB — GROUP A STREP BY PCR: Group A Strep by PCR: NOT DETECTED

## 2021-08-14 MED ORDER — ONDANSETRON HCL 4 MG PO TABS: 4.0000 mg | ORAL_TABLET | Freq: Four times a day (QID) | ORAL | 0 refills | Status: AC

## 2021-08-14 MED ORDER — LACTATED RINGERS IV BOLUS
1000.0000 mL | Freq: Once | INTRAVENOUS | Status: AC
  Administered 2021-08-14: 15:00:00 1000 mL via INTRAVENOUS

## 2021-08-14 MED ORDER — ONDANSETRON 4 MG PO TBDP
4.0000 mg | ORAL_TABLET | Freq: Once | ORAL | Status: AC
  Administered 2021-08-14: 15:00:00 4 mg via ORAL
  Filled 2021-08-14: qty 1

## 2021-08-14 NOTE — ED Triage Notes (Signed)
Patient states he has had a headache since yesterday, reports hot and cold chills as well as a sore throat. Recent sick contacts at work.

## 2021-08-14 NOTE — ED Provider Notes (Signed)
°  Physical Exam  BP (!) 157/103 (BP Location: Left Arm)    Pulse 94    Temp 97.9 F (36.6 C) (Oral)    Resp 18    Ht 5\' 5"  (1.651 m)    Wt 78 kg    SpO2 97%    BMI 28.62 kg/m   Physical Exam Vitals and nursing note reviewed.  Constitutional:      Appearance: Normal appearance.  HENT:     Head: Normocephalic and atraumatic.  Eyes:     Conjunctiva/sclera: Conjunctivae normal.  Cardiovascular:     Rate and Rhythm: Normal rate and regular rhythm.  Pulmonary:     Effort: Pulmonary effort is normal. No respiratory distress.     Breath sounds: Normal breath sounds.  Abdominal:     General: Abdomen is flat. There is no distension.     Palpations: Abdomen is soft.     Tenderness: There is generalized abdominal tenderness. There is no guarding or rebound.  Skin:    General: Skin is warm and dry.  Neurological:     General: No focal deficit present.     Mental Status: He is alert.    ED Course/Procedures     Procedures  MDM  Patient is 45 year old male who presents emergency department with headache, chills, and sore throat.  Patient care taken over from previous provider Ransom PA-C at shift change, see her note for full HPI.  Spanish interpreter used during interview.  There was concern for acute pancreatitis, as patient drinks 2-3 drinks daily, and has epigastric tenderness on exam.  Lipase 46.  On my evaluation patient's abdominal tenderness appears generalized, there is no guarding or rigidity.  He states his symptoms have improved after Zofran and IV fluids, and he is not feeling nauseous at this time.  He appears clinically well, and is hemodynamically stable.  He is not requiring admission or inpatient treatment for his symptoms at this time.  Plan to discharge to home with symptomatic management of likely viral gastroenteritis.  Discussed reasons return to the emergency department, and patient agreeable to plan.  Portions of this report may have been transcribed using voice  recognition software. Every effort was made to ensure accuracy; however, inadvertent computerized transcription errors may be present.    49 08/14/21 1658    08/16/21, MD 08/14/21 2337

## 2021-08-14 NOTE — ED Provider Notes (Signed)
[facility]-EMERGENCY DEPT Provider Note   CSN: [id number] Arrival date & time: 08/14/21  1247     History No chief complaint on file.   Seth Bailey is a 45 y.o. male presents the emergency department with headache, subjective fever/chills, sore throat, body aches, rhinorrhea, nasal congestion, greater than episodes of vomiting, abdominal pain, and nausea since yesterday.  Denies any chest pain, shortness of breath, diarrhea, constipation, dysuria, hematuria, cough, dark or tarry stools.  Patient reports he took "some medication" for migraines last night with mild relief.  He denies any medical history, although the patient does not see a primary care provider.  Denies any surgical history.  Denies any daily medications.  No known drug allergies.  Denies any tobacco use, patient mentions he is around 2-3 beers daily.  Denies any drug use ever.   The patient's daughter, Morrie Sheldon, as acting translator per the patient request. HPI     History reviewed. No pertinent past medical history.  There are no problems to display for this patient.   History reviewed. No pertinent surgical history.     No family history on file.  Social History   Tobacco Use   Smoking status: Never   Smokeless tobacco: Never  Substance Use Topics   Alcohol use: Yes    Comment: occasionally   Drug use: Never    Home Medications Prior to Admission medications   Medication Sig Start Date End Date Taking? Authorizing Provider  ibuprofen (ADVIL) 600 MG tablet Take 1 tablet (600 mg total) by mouth every 6 (six) hours as needed. 09/18/20   Lamptey, Britta Mccreedy, MD    Allergies    Patient has no known allergies.  Review of Systems   Review of Systems  Constitutional:  Positive for chills and fever.  HENT:  Positive for congestion, rhinorrhea and sore throat. Negative for drooling, ear pain and trouble swallowing.   Eyes:  Negative for photophobia, discharge and visual  disturbance.  Respiratory:  Negative for cough and shortness of breath.   Cardiovascular:  Negative for chest pain and palpitations.  Gastrointestinal:  Positive for nausea and vomiting. Negative for abdominal pain and diarrhea.  Genitourinary:  Negative for dysuria and hematuria.  Musculoskeletal:  Positive for myalgias. Negative for arthralgias, back pain and joint swelling.  Skin:  Negative for color change and rash.  Neurological:  Positive for headaches. Negative for syncope and weakness.   Physical Exam Updated Vital Signs BP (!) 157/103 (BP Location: Left Arm)    Pulse 94    Temp 97.9 F (36.6 C) (Oral)    Resp 18    Ht 5\' 5"  (1.651 m)    Wt 78 kg    SpO2 97%    BMI 28.62 kg/m   Physical Exam Vitals and nursing note reviewed.  Constitutional:      General: He is not in acute distress.    Appearance: He is well-developed. He is not toxic-appearing.  HENT:     Head: Normocephalic and atraumatic.     Right Ear: Ear canal and external ear normal.     Left Ear: Ear canal and external ear normal.     Ears:     Comments: TM scarring to bilateral Tms. No effusion, erythema, or bulge noted.     Nose: Nose normal.     Mouth/Throat:     Mouth: Mucous membranes are moist.     Pharynx: No oropharyngeal exudate or posterior oropharyngeal erythema.     Comments:  Poor dentition.  Uvula midline.  Airway patent. Eyes:     Conjunctiva/sclera: Conjunctivae normal.  Cardiovascular:     Rate and Rhythm: Normal rate and regular rhythm.     Heart sounds: No murmur heard. Pulmonary:     Effort: Pulmonary effort is normal. No respiratory distress.     Breath sounds: Normal breath sounds.  Abdominal:     Palpations: Abdomen is soft.     Tenderness: There is abdominal tenderness. There is no guarding or rebound.     Comments: Reports epigastric abdominal tenderness to palpation.  No guarding or rebound appreciated.  Musculoskeletal:        General: No swelling.     Cervical back: Neck supple.   Skin:    General: Skin is warm and dry.     Capillary Refill: Capillary refill takes less than 2 seconds.  Neurological:     Mental Status: He is alert.  Psychiatric:        Mood and Affect: Mood normal.    ED Results / Procedures / Treatments   Labs (all labs ordered are listed, but only abnormal results are displayed) Labs Reviewed  RESP PANEL BY RT-PCR (FLU A&B, COVID) ARPGX2  GROUP A STREP BY PCR  CBC WITH DIFFERENTIAL/PLATELET  LIPASE, BLOOD  COMPREHENSIVE METABOLIC PANEL  URINALYSIS, ROUTINE W REFLEX MICROSCOPIC    EKG None  Radiology No results found.  Procedures Procedures   Medications Ordered in ED Medications  ondansetron (ZOFRAN-ODT) disintegrating tablet 4 mg (4 mg Oral Given 08/14/21 1433)  lactated ringers bolus 1,000 mL (1,000 mLs Intravenous New Bag/Given 08/14/21 1511)    ED Course  I have reviewed the triage vital signs and the nursing notes.  Pertinent labs & imaging results that were available during my care of the patient were reviewed by me and considered in my medical decision making (see chart for details).  45 year old male with poor health follow-up presents the emergency department for evaluation of flulike symptoms along with abdominal pain, nausea, and vomiting.  Differential diagnosis includes is not limited to COVID, flu, strep, viral illness, pancreatitis, electrolyte abnormality.  On physical exam, the patient has poor dentition and some mild tenderness palpation of his epigastric abdomen.  No guarding or rebound appreciated.  Vital signs are normal other than patient's mildly hypertensive at 157/103.  No previous measurement to compare this to.  Afebrile.  COVID, flu, strep test negative.  Ordered CBC, CMP, lipase, and urinalysis.  1 L lactated ringer bolus ordered long with some Zofran for his nausea.  Lab results pending.  Will handoff patient to oncoming shift.  3:30 PM Care of [place] transferred to [place] at the end of my shift as the patient will require reassessment once labs/imaging have resulted. Patient presentation, ED course, and plan of care discussed with review of all pertinent labs and imaging. Please see his/her note for further details regarding further ED course and disposition. Plan at time of handoff is possible CT abdomen pending lab results. The patient is currently receiving 1L or LR and has been given Zofran without episode of emesis. Likely discharge pending normal lab work. This may be altered or completely changed at the discretion of the oncoming team pending results of further workup.    MDM Rules/Calculators/A&P   Final Clinical Impression(s) / ED Diagnoses Final diagnoses:  None    Rx / DC Orders ED Discharge Orders     None        Norval Gable,  Armida Sans 08/14/21 1532    Truddie Hidden, MD 08/15/21 [phone]

## 2021-08-14 NOTE — Discharge Instructions (Addendum)
Lo vieron en el departamento de emergencias por nuseas y vmitos.   Seth Bailey de laboratorio de hoy se ve bien. Sus pruebas de influenza y COVID dieron negativo. Creo que probablemente tengas un virus estomacal. Le estoy recetando un medicamento para las nuseas para que lo tome cada 6 horas segn sea necesario. Asegrese de beber mucha agua y llevar una dieta blanda.   Regrese a la sala de emergencias por vmitos continuos, fiebre o cambios en el uso del bao.

## 2021-08-14 NOTE — ED Provider Notes (Incomplete)
°  [facility]-EMER[facility]PT Provider Note   CSN: [id number] Arrival date & time: 08/14/21  1247     History No chief complaint on file.   Seth Bailey is a 45 y.o. male.  HPI     History reviewed. No pertinent past medical history.  There are no problems to display for this patient.   History reviewed. No pertinent surgical history.     No family history on file.  Social History   Tobacco Use   Smoking status: Never   Smokeless tobacco: Never  Substance Use Topics   Alcohol use: Yes    Comment: occasionally   Drug use: Never    Home Medications Prior to Admission medications   Medication Sig Start Date End Date Taking? Authorizing Provider  ibuprofen (ADVIL) 600 MG tablet Take 1 tablet (600 mg total) by mouth every 6 (six) hours as needed. 09/18/20   Lamptey, Britta Mccreedy, MD    Allergies    Patient has no known allergies.  Review of Systems   Review of Systems  Physical Exam Updated Vital Signs BP (!) 157/103 (BP Location: Left Arm)    Pulse 94    Temp 97.9 F (36.6 C) (Oral)    Resp 18    Ht 5\' 5"  (1.651 m)    Wt 78 kg    SpO2 97%    BMI 28.62 kg/m   Physical Exam  ED Results / Procedures / Treatments   Labs (all labs ordered are listed, but only abnormal results are displayed) Labs Reviewed  RESP PANEL BY RT-PCR (FLU A&B, COVID) ARPGX2  GROUP A STREP BY PCR    EKG None  Radiology No results found.  Procedures Procedures   Medications Ordered in ED Medications - No data to display  ED Course  I have reviewed the triage vital signs and the nursing notes.  Pertinent labs & imaging results that were available during my care of the patient were reviewed by me and considered in my medical decision making (see chart for details).    MDM Rules/Calculators/A&P                         *** {Remember to document critical care time when appropriate:1}   Final Clinical Impression(s) / ED Diagnoses Final  diagnoses:  None    Rx / DC Orders ED Discharge Orders     None
